# Patient Record
Sex: Male | Born: 1955 | Race: Black or African American | Hispanic: No | Marital: Single
Health system: Southern US, Community
[De-identification: ages and names within clinical notes are randomized; demographics above are authoritative.]

---

## 2020-09-22 ENCOUNTER — Emergency Department: Payer: No Typology Code available for payment source

## 2020-09-22 ENCOUNTER — Other Ambulatory Visit: Payer: Self-pay

## 2020-09-22 ENCOUNTER — Emergency Department
Admission: EM | Admit: 2020-09-22 | Discharge: 2020-09-22 | Disposition: A | Discharge status: Home / Self Care | Payer: No Typology Code available for payment source | Attending: Emergency Medicine | Admitting: Emergency Medicine

## 2020-09-22 DIAGNOSIS — W19XXXA Unspecified fall, initial encounter: Secondary | ICD-10-CM

## 2020-09-22 DIAGNOSIS — D72829 Elevated white blood cell count, unspecified: Secondary | ICD-10-CM | POA: Insufficient documentation

## 2020-09-22 DIAGNOSIS — S3991XA Unspecified injury of abdomen, initial encounter: Secondary | ICD-10-CM | POA: Insufficient documentation

## 2020-09-22 DIAGNOSIS — W132XXA Fall from, out of or through roof, initial encounter: Secondary | ICD-10-CM | POA: Diagnosis not present

## 2020-09-22 DIAGNOSIS — Y9289 Other specified places as the place of occurrence of the external cause: Secondary | ICD-10-CM | POA: Diagnosis not present

## 2020-09-22 DIAGNOSIS — R319 Hematuria, unspecified: Secondary | ICD-10-CM | POA: Diagnosis not present

## 2020-09-22 DIAGNOSIS — M545 Low back pain, unspecified: Secondary | ICD-10-CM | POA: Diagnosis not present

## 2020-09-22 LAB — URINALYSIS, COMPLETE (UACMP) WITH MICROSCOPIC
Bilirubin Urine: NEGATIVE
Glucose, UA: NEGATIVE mg/dL
Ketones, ur: NEGATIVE mg/dL
Nitrite: NEGATIVE
Protein, ur: NEGATIVE mg/dL
Specific Gravity, Urine: 1.026 (ref 1.005–1.030)
Squamous Epithelial / HPF: NONE SEEN (ref 0–5)
pH: 9 — ABNORMAL HIGH (ref 5.0–8.0)

## 2020-09-22 LAB — CBC WITH DIFFERENTIAL/PLATELET
Abs Immature Granulocytes: 0.04 10*3/uL (ref 0.00–0.07)
Basophils Absolute: 0 10*3/uL (ref 0.0–0.1)
Basophils Relative: 0 %
Eosinophils Absolute: 0.1 10*3/uL (ref 0.0–0.5)
Eosinophils Relative: 1 %
HCT: 37.8 % — ABNORMAL LOW (ref 39.0–52.0)
Hemoglobin: 13.1 g/dL (ref 13.0–17.0)
Immature Granulocytes: 0 %
Lymphocytes Relative: 15 %
Lymphs Abs: 1.8 10*3/uL (ref 0.7–4.0)
MCH: 32 pg (ref 26.0–34.0)
MCHC: 34.7 g/dL (ref 30.0–36.0)
MCV: 92.4 fL (ref 80.0–100.0)
Monocytes Absolute: 0.8 10*3/uL (ref 0.1–1.0)
Monocytes Relative: 7 %
Neutro Abs: 9.4 10*3/uL — ABNORMAL HIGH (ref 1.7–7.7)
Neutrophils Relative %: 77 %
Platelets: 256 10*3/uL (ref 150–400)
RBC: 4.09 MIL/uL — ABNORMAL LOW (ref 4.22–5.81)
RDW: 13.6 % (ref 11.5–15.5)
WBC: 12.1 10*3/uL — ABNORMAL HIGH (ref 4.0–10.5)
nRBC: 0 % (ref 0.0–0.2)

## 2020-09-22 LAB — COMPREHENSIVE METABOLIC PANEL
ALT: 15 U/L (ref 0–44)
AST: 24 U/L (ref 15–41)
Albumin: 4.1 g/dL (ref 3.5–5.0)
Alkaline Phosphatase: 56 U/L (ref 38–126)
Anion gap: 6 (ref 5–15)
BUN: 12 mg/dL (ref 8–23)
CO2: 27 mmol/L (ref 22–32)
Calcium: 8.9 mg/dL (ref 8.9–10.3)
Chloride: 102 mmol/L (ref 98–111)
Creatinine, Ser: 0.86 mg/dL (ref 0.61–1.24)
GFR, Estimated: >60 mL/min (ref >60)
Glucose, Bld: 113 mg/dL — ABNORMAL HIGH (ref 70–99)
Potassium: 3.6 mmol/L (ref 3.5–5.1)
Sodium: 135 mmol/L (ref 135–145)
Total Bilirubin: 1.2 mg/dL (ref 0.3–1.2)
Total Protein: 8.1 g/dL (ref 6.5–8.1)

## 2020-09-22 LAB — CK: Total CK: 170 U/L (ref 49–397)

## 2020-09-22 MED ORDER — IOHEXOL 350 MG/ML SOLN
100.0000 mL | Freq: Once | INTRAVENOUS | Status: AC | PRN
  Administered 2020-09-22: 100 mL via INTRAVENOUS

## 2020-09-22 MED ORDER — MORPHINE SULFATE (PF) 4 MG/ML IV SOLN
4.0000 mg | Freq: Once | INTRAVENOUS | Status: AC
  Administered 2020-09-22: 4 mg via INTRAVENOUS
  Filled 2020-09-22: qty 1

## 2020-09-22 NOTE — ED Triage Notes (Signed)
Pt states that he fell through a ceiling at work about 4 hours ago. States he fell about 6 feet and landed on a joist on his bottom. States lower abd pain, back pain, and blood in urine since then. Ambulatory at this time.

## 2020-09-22 NOTE — ED Provider Notes (Signed)
-----------------------------------------   3:38 PM on 09/22/2020 ----------------------------------------- I personally seen and evaluated the patient in conjunction with physician assistant Heron Sabins.  Patient appears very well.  Denies any pain at all currently.  Denies any syncope denies any incontinence.  Did states shortly after the incident he urinated what appeared to be dark, but is since cleared.  We will send a urinalysis.  Patient has a benign abdominal exam currently.  No back tenderness no step-offs or deformities.  No CVA tenderness.  No signs of weakness.  Patient's work-up is thus far reassuring CT scan did note hyperattenuation around the rectum/anus however rectal examination shows no gross blood and is nontender.  As the patient appears well with reassuring physical exam I do believe he would be safe for discharge home pending a urinalysis to ensure no hematuria.   Minna Antis, MD 09/22/20 2204

## 2020-09-22 NOTE — ED Provider Notes (Signed)
[facility] Emergency Department Provider Note  ____________________________________________   Event Date/Time   First MD Initiated Contact with Patient 09/22/20 1306     (approximate)  I have reviewed the triage vital signs and the nursing notes.   HISTORY  Chief Complaint Fall and Hematuria  HPI Austin Morse is a 65 y.o. male with no significant past medical history who reports to the emergency department after a significant fall.  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  He denies urinary incontinence.  He is ambulatory, denies saddle anesthesia, however is reporting significant abdominal pain and low back pain associated with gross hematuria that has now seemingly resolved.  He denies any head trauma during the fall, denies neck pain.         History reviewed. No pertinent past medical history.  There are no problems to display for this patient.    Prior to Admission medications   Not on File    Allergies Patient has no allergy information on record.  History reviewed. No pertinent family history.  Social History    Review of Systems Constitutional: + Trauma, no fever/chills Eyes: No visual changes. ENT: No sore throat. Cardiovascular: Denies chest pain. Respiratory: Denies shortness of breath. Gastrointestinal: + abdominal pain.  No nausea, no vomiting.  No diarrhea.  No constipation. Genitourinary: + Hematuria negative for dysuria. Musculoskeletal: + back pain. Skin: Negative for rash. Neurological: Negative for headaches, focal weakness or numbness.  ____________________________________________   PHYSICAL EXAM:  VITAL SIGNS: ED Triage Vitals  Enc Vitals Group     BP 09/22/20 1258 (!) 144/95     Pulse Rate 09/22/20 1258 87     Resp 09/22/20 1258 18     Temp 09/22/20 1258 98.6 F (37 C)     Temp src --       SpO2 09/22/20 1258 95 %     Weight 09/22/20 1303 165 lb (74.8 kg)     Height 09/22/20 1303 6' (1.829 m)     Head Circumference --      Peak Flow --      Pain Score 09/22/20 1303 8     Pain Loc --      Pain Edu? --      Excl. in GC? --    Constitutional: Alert and oriented. Well appearing and in no acute distress. Eyes: Conjunctivae are normal. PERRL. EOMI. Head: Atraumatic. Nose: No congestion/rhinnorhea. Mouth/Throat: Mucous membranes are moist.   Neck: No stridor.  No tenderness to palpation of the midline or paraspinals of the cervical spine.  Full range of motion. Cardiovascular: Normal rate, regular rhythm. Grossly normal heart sounds.  Good peripheral circulation. Respiratory: Normal respiratory effort.  No retractions. Lungs CTAB. Gastrointestinal: Soft, with possible mild distention.  Tender to palpation without rebound or peritoneal signs of the lower abdomen and suprapubic region. No abdominal bruits. No CVA tenderness.  Rectal exam following CT findings is normal with no gross bleeding externally or on digital rectal exam. Musculoskeletal: No tenderness to palpation of the midline of the thoracic or lumbar spine, no paraspinal tenderness.  No step-off deformities or crepitus.  No lower extremity tenderness nor edema.  No joint effusions. Neurologic:  Normal speech and language.  Cranial nerves II through XII grossly intact.  No gross focal neurologic deficits are appreciated. No gait instability. Skin:  Skin is warm, dry and intact. No  rash noted. Psychiatric: Mood and affect are normal. Speech and behavior are normal.  ____________________________________________   LABS (all labs ordered are listed, but only abnormal results are displayed)  Labs Reviewed  COMPREHENSIVE METABOLIC PANEL - Abnormal; Notable for the following components:      Result Value   Glucose, Bld 113 (*)    All other components within normal limits  CBC WITH DIFFERENTIAL/PLATELET - Abnormal; Notable  for the following components:   WBC 12.1 (*)    RBC 4.09 (*)    HCT 37.8 (*)    Neutro Abs 9.4 (*)    All other components within normal limits  URINALYSIS, COMPLETE (UACMP) WITH MICROSCOPIC - Abnormal; Notable for the following components:   Color, Urine YELLOW (*)    APPearance CLEAR (*)    pH 9.0 (*)    Hgb urine dipstick MODERATE (*)    Leukocytes,Ua MODERATE (*)    Bacteria, UA RARE (*)    All other components within normal limits  CK  TYPE AND SCREEN   ____________________________________________  RADIOLOGY  Official radiology report(s): CT CHEST ABDOMEN PELVIS W CONTRAST  Result Date: 09/22/2020 CLINICAL DATA:  Fall 6 ft.  Abdominal trauma. EXAM: CT CHEST, ABDOMEN, AND PELVIS WITH CONTRAST TECHNIQUE: Multidetector CT imaging of the chest, abdomen and pelvis was performed following the standard protocol during bolus administration of intravenous contrast. CONTRAST:  OMNIPAQUE IOHEXOL 350 MG/ML SOLN COMPARISON:  None. FINDINGS: CT CHEST FINDINGS Cardiovascular: Heart is normal size. Aorta is normal caliber. Scattered coronary artery and aortic calcifications. Mediastinum/Nodes: No mediastinal, hilar, or axillary adenopathy. Trachea and esophagus are unremarkable. Lungs/Pleura: Dependent atelectasis in the lower lobes. No confluent opacities or effusions. Musculoskeletal: No acute bony abnormality. CT ABDOMEN PELVIS FINDINGS Hepatobiliary: No hepatic injury or perihepatic hematoma. Gallbladder is unremarkable. Pancreas: No focal abnormality or ductal dilatation. Spleen: No splenic injury or perisplenic hematoma. Adrenals/Urinary Tract: No adrenal hemorrhage or renal injury identified. Bladder is unremarkable. Stomach/Bowel: High-density material is noted in the region of the anus/rectum seen on image 121 of series 2 of unknown etiology. Is the patient having GI bleed issues? This could be extravasation of contrast. Remainder the bowel is unremarkable. No bowel obstruction.  Vascular/Lymphatic: No evidence of aneurysm or adenopathy. Scattered aortic calcifications. Reproductive: No visible focal abnormality. Other: No free fluid or free air. Musculoskeletal: No acute bony abnormality IMPRESSION: Area of high density noted in the anus/rectum. Given the lack of oral contrast, I cannot exclude vascular contrast extravasation. Recommend clinical correlation for possible anal injury with bleeding. This would likely be visible on visual or digital rectal exam given the low location. No evidence of solid organ injury or free air. Aortic atherosclerosis. Electronically Signed   By: Charlett Nose M.D.   On: 09/22/2020 15:21   CT T-SPINE NO CHARGE  Result Date: 09/22/2020 CLINICAL DATA:  Abdominal trauma, hematuria; fall through ceiling with back pain EXAM: CT Thoracic and Lumbar spine with contrast TECHNIQUE: Multiplanar CT images of the thoracic and lumbar spine were reconstructed from contemporary CT of the Chest, Abdomen, and Pelvis CONTRAST:  No additional COMPARISON:  None. FINDINGS: CT THORACIC SPINE Alignment: Preserved. Vertebrae: No acute fracture. Vertebral body heights are maintained. Paraspinal and other soft tissues: Extra-spinal findings are better evaluated on concurrent dedicated imaging. Disc levels: Intervertebral disc heights are maintained. There is no significant osseous encroachment on the spinal canal or neural foramina. CT LUMBAR SPINE Segmentation: 5 lumbar type vertebrae. Alignment: Preserved. Vertebrae: No acute fracture. Vertebral body heights are maintained.  Paraspinal and other soft tissues: Extra-spinal findings are better evaluated on concurrent dedicated imaging. Disc levels: Disc bulges at L4-L5 and L5-S1. Endplate osteophytic ridging at L5-S1. Mild lower lumbar facet hypertrophy. No high-grade canal stenosis. Mild foraminal narrowing. IMPRESSION: No acute fracture of the thoracolumbar spine. Electronically Signed   By: Guadlupe Spanish M.D.   On: 09/22/2020  15:02   CT L-SPINE NO CHARGE  Result Date: 09/22/2020 CLINICAL DATA:  Abdominal trauma, hematuria; fall through ceiling with back pain EXAM: CT Thoracic and Lumbar spine with contrast TECHNIQUE: Multiplanar CT images of the thoracic and lumbar spine were reconstructed from contemporary CT of the Chest, Abdomen, and Pelvis CONTRAST:  No additional COMPARISON:  None. FINDINGS: CT THORACIC SPINE Alignment: Preserved. Vertebrae: No acute fracture. Vertebral body heights are maintained. Paraspinal and other soft tissues: Extra-spinal findings are better evaluated on concurrent dedicated imaging. Disc levels: Intervertebral disc heights are maintained. There is no significant osseous encroachment on the spinal canal or neural foramina. CT LUMBAR SPINE Segmentation: 5 lumbar type vertebrae. Alignment: Preserved. Vertebrae: No acute fracture. Vertebral body heights are maintained. Paraspinal and other soft tissues: Extra-spinal findings are better evaluated on concurrent dedicated imaging. Disc levels: Disc bulges at L4-L5 and L5-S1. Endplate osteophytic ridging at L5-S1. Mild lower lumbar facet hypertrophy. No high-grade canal stenosis. Mild foraminal narrowing. IMPRESSION: No acute fracture of the thoracolumbar spine. Electronically Signed   By: Guadlupe Spanish M.D.   On: 09/22/2020 15:02    ____________________________________________   INITIAL IMPRESSION / ASSESSMENT AND PLAN / ED COURSE  As part of my medical decision making, I reviewed the following data within the electronic MEDICAL RECORD NUMBER Nursing notes reviewed and incorporated, Labs reviewed, Evaluated by EM attending Dr. Marisa Severin, and Notes from prior ED visits        Patient is a 65 year old male who presents to the emergency department after a trauma falling approximately 6 feet onto a floor joist onto his bottom.  He then had significant low back pain that was not reproducible to palpation, but with associated hematuria initially that has now  resolved.  See HPI for further details.  Initial evaluation was obtained with CBC, CMP, urinalysis.  CBC with a mild leukocytosis of 12.1 with left shift.  CMP with mild elevation in glucose, otherwise within normal limits.  CT of the chest abdomen pelvis with contrast was obtained given mechanism and concern for intra-abdominal pathology.  CT of the lumbar and thoracic spine was also obtained given mechanism with associated back pain.  CT of the thoracic and lumbar spine was within normal limits.  CT of the chest abdomen pelvis with contrast was concerning for possible contrast-enhancement at the rectum and could not rule out extravasation.  At that time, repeat exam was had with attending Dr. Lenard Lance present for chaperoning, and there was no gross blood noted on external or digital rectal exam.  Urinalysis then returned with rare bacteria, moderate leukocytes and moderate hemoglobin with few red blood cells.  Patient does not have any dysuria or preceding symptoms concerning for UTI, will send this urine for culture.  Given the moderate hemoglobin with trauma, CK was ordered by Dr. Lenard Lance to rule out traumatic rhabdomyolysis, and this was within normal limits at 170.   At this time, the patient's evaluation and labs and imaging are reassuring.  He is not reporting any significant pain at this time and reports now resolution in his gross hematuria.  At this time, feel the patient is stable for discharge.  Return precautions were discussed at length and patient is amenable with returning if he has any worsening symptoms.  Patient also seen and examined by attending Dr. Marisa Severin, and later by Dr. Lenard Lance at change of shift who both agree with the above work-up and plan of care.      ____________________________________________   FINAL CLINICAL IMPRESSION(S) / ED DIAGNOSES  Final diagnoses:  Abdominal trauma  Hematuria  Fall, initial encounter  Acute low back pain, unspecified back  pain laterality, unspecified whether sciatica present     ED Discharge Orders     None        Note:  This document was prepared using Dragon voice recognition software and may include unintentional dictation errors.    Lucy Chris, PA 09/22/20 1945    Dionne Bucy, MD 09/23/20 [id number]

## 2020-09-22 NOTE — ED Notes (Signed)
Pt alert and oriented x 4 verbalized discharge instructions

## 2020-09-22 NOTE — Discharge Instructions (Addendum)
Please follow up with your primary care. Return to the Er if you develop any worsening pain or changes in urination or other significant changes. Take Tylenol, up to 1000mg  4x daily as needed for pain.

## 2020-09-22 NOTE — ED Notes (Signed)
Per manager Tim, patient needs a UDS prior to leaving hospital. Notified appropriate trained personal to obtain specimen.

## 2020-09-25 LAB — URINE CULTURE: Culture: >=100000 — AB

## 2020-09-26 NOTE — Progress Notes (Signed)
ED culture results-  65YOM presented to the ED with back pain and blood in the urine after a fall. Hematuria had since cleared once in the ED per patient. No reports of dysuria. UA was clear with negative nitrites, moderate leukocytes, and rare bacteria. No antibiotics prescribed at discharge. Urine culture resulted E. Coli. Do not recommend initiating antibiotics for this asymptomatic bacteruria. Discussed case with Dr. Katrinka Blazing who agreed, no antibiotics at this time.   Jaynie Bream, PharmD Pharmacy Resident  09/26/2020 5:36 PM

## 2022-12-25 IMAGING — CT CT CHEST-ABD-PELV W/ CM
2 of 6 series · 11 of 36 positions shown, 16 images · IV contrast (omnipaque)
Comparison: None.

CLINICAL DATA: Fall 6 ft.  Abdominal trauma.

EXAM:
CT CHEST, ABDOMEN, AND PELVIS WITH CONTRAST
TECHNIQUE: Multidetector CT imaging of the chest, abdomen and pelvis was
performed following the standard protocol during bolus
administration of intravenous contrast.
CONTRAST:  100mL OMNIPAQUE IOHEXOL 350 MG/ML SOLN

[Series 2: cap with · axial · 0.73mm/px · z∈[-843,-328]mm · 8 of 133 slices shown, 13 images]
[im 15/133  mediastinal]
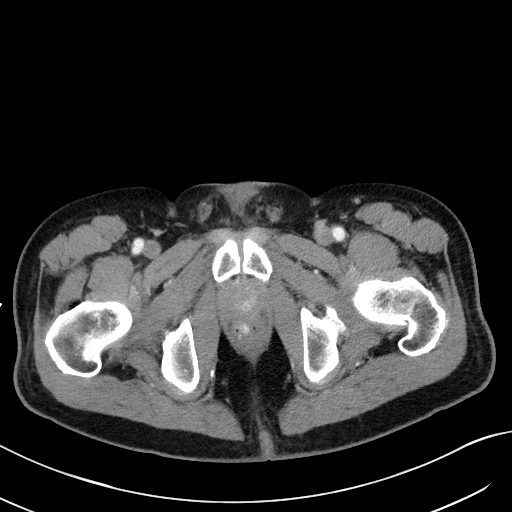
[im 15/133  bone]
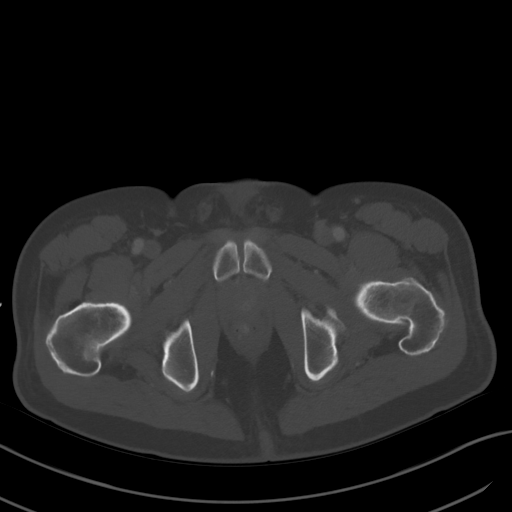
[im 30/133  mediastinal]
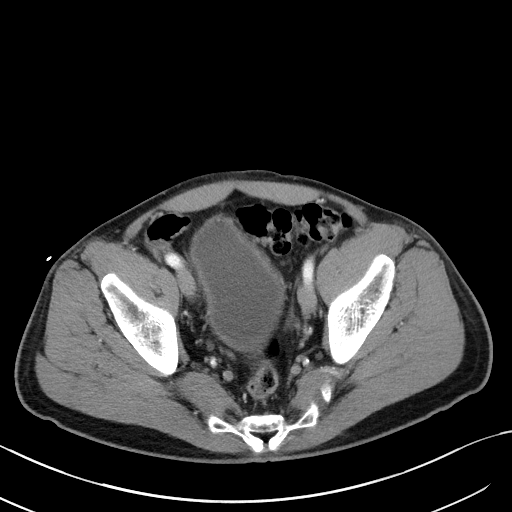
[im 45/133  mediastinal]
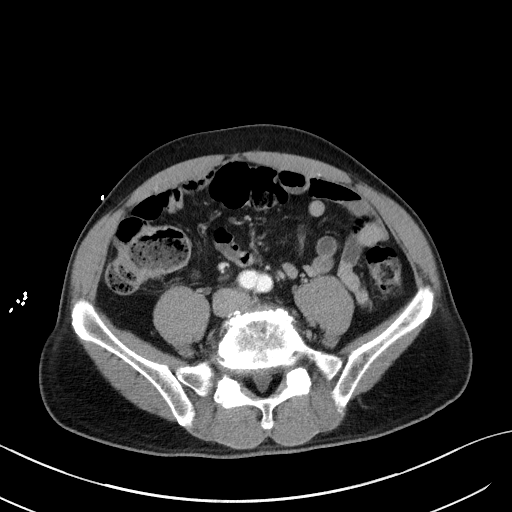
[im 59/133  mediastinal]
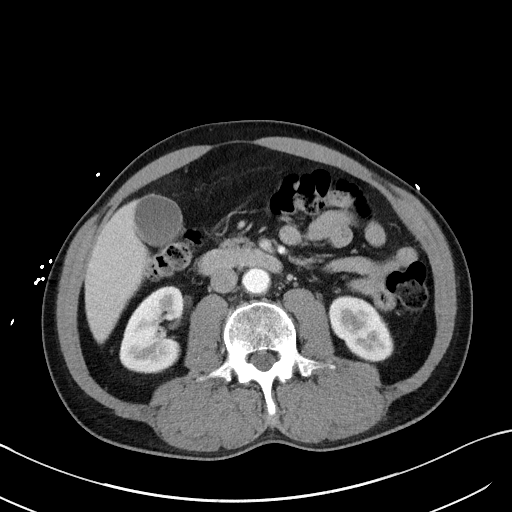
[im 74/133  mediastinal]
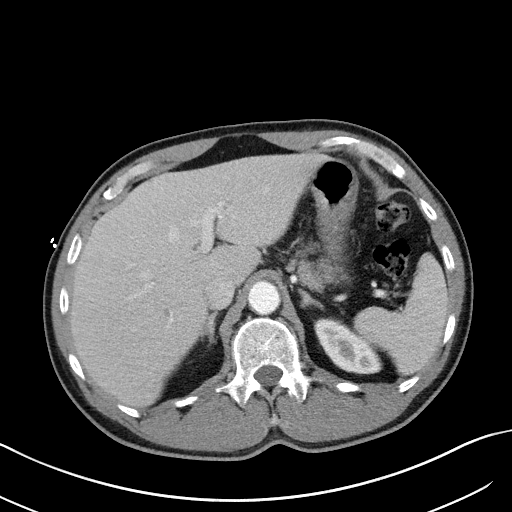
[im 74/133  lung]
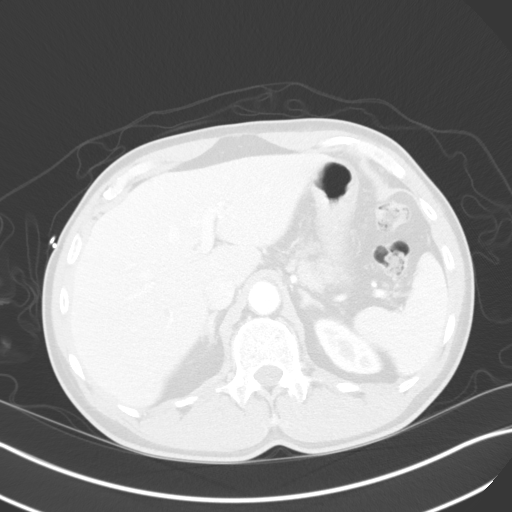
[im 89/133  mediastinal]
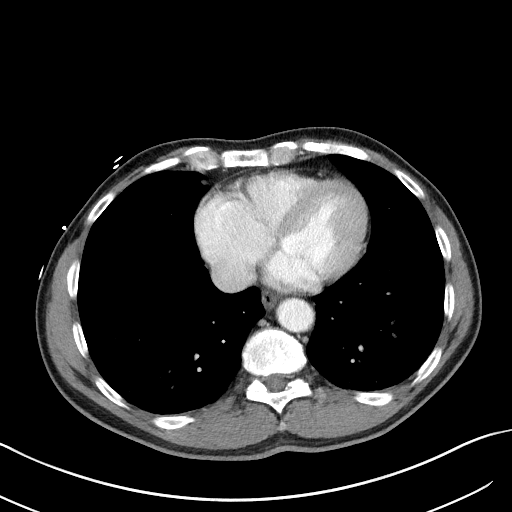
[im 89/133  lung]
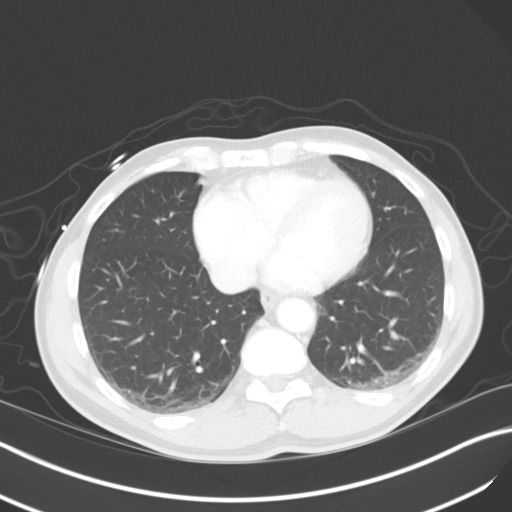
[im 103/133  mediastinal]
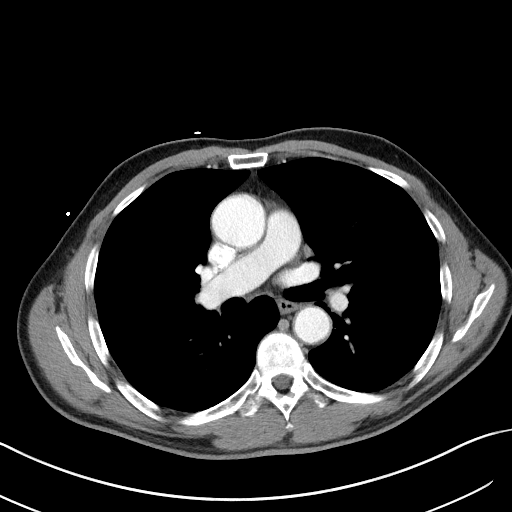
[im 103/133  lung]
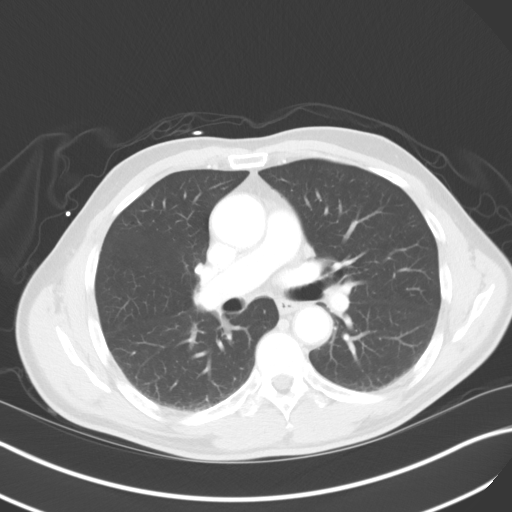
[im 118/133  mediastinal]
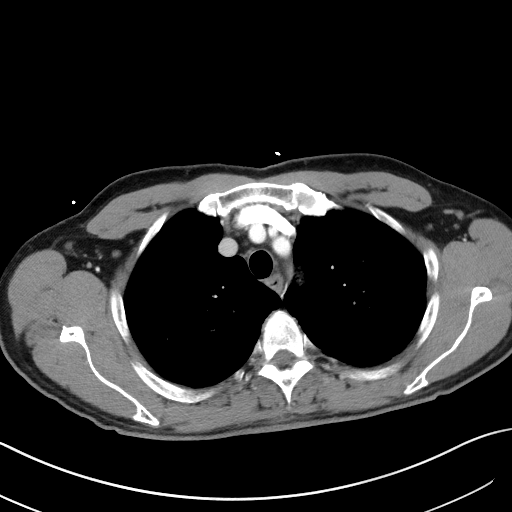
[im 118/133  lung]
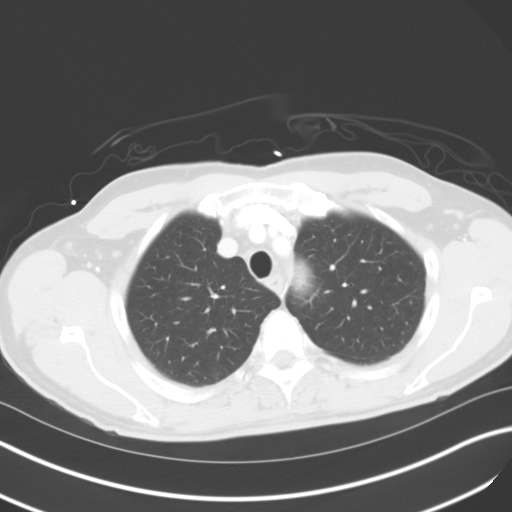

[Series 5: coronals · coronal · 0.82mm/px · 3 of 127 slices shown]
[im 26/127  mediastinal]
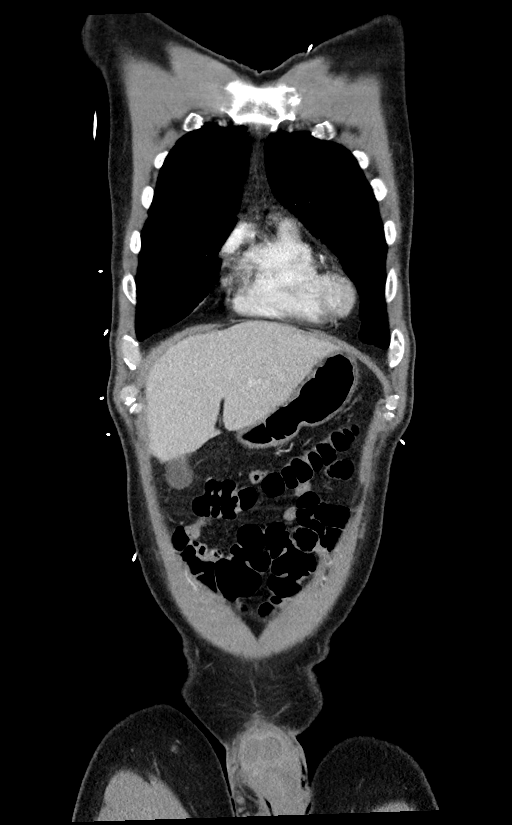
[im 51/127  mediastinal]
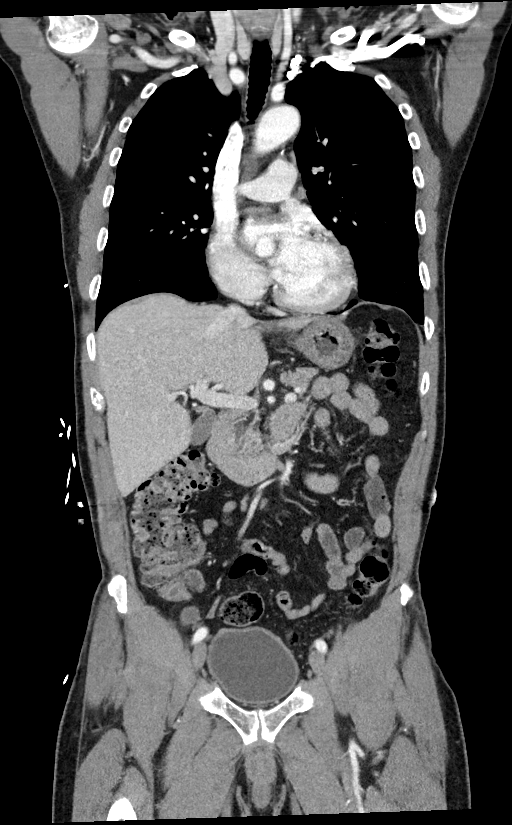
[im 76/127  mediastinal]
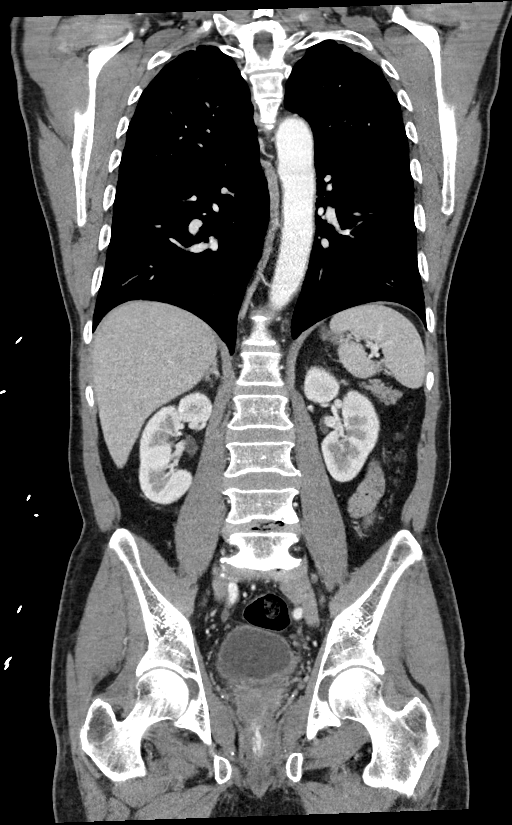

[11 of 36 positions shown; findings below may reference images not displayed]

FINDINGS: CT CHEST FINDINGS

Cardiovascular: Heart is normal size. Aorta is normal caliber.
Scattered coronary artery and aortic calcifications.

Mediastinum/Nodes: No mediastinal, hilar, or axillary adenopathy.
Trachea and esophagus are unremarkable.

Lungs/Pleura: Dependent atelectasis in the lower lobes. No confluent
opacities or effusions.

Musculoskeletal: No acute bony abnormality.

CT ABDOMEN PELVIS FINDINGS

Hepatobiliary: No hepatic injury or perihepatic hematoma.
Gallbladder is unremarkable.

Pancreas: No focal abnormality or ductal dilatation.

Spleen: No splenic injury or perisplenic hematoma.

Adrenals/Urinary Tract: No adrenal hemorrhage or renal injury
identified. Bladder is unremarkable.

Stomach/Bowel: High-density material is noted in the region of the
anus/rectum seen on image 121 of series 2 of unknown etiology. Is
the patient having GI bleed issues? This could be extravasation of
contrast. Remainder the bowel is unremarkable. No bowel obstruction.

Vascular/Lymphatic: No evidence of aneurysm or adenopathy. Scattered
aortic calcifications.

Reproductive: No visible focal abnormality.

Other: No free fluid or free air.

Musculoskeletal: No acute bony abnormality
IMPRESSION: Area of high density noted in the anus/rectum. Given the lack of
oral contrast, I cannot exclude vascular contrast extravasation.
Recommend clinical correlation for possible anal injury with
bleeding. This would likely be visible on visual or digital rectal
exam given the low location.

No evidence of solid organ injury or free air.

Aortic atherosclerosis.

## 2022-12-25 IMAGING — CT CT T SPINE W/O CM
3 series · 9 of 33 positions shown, 10 images · IV contrast (agent unspecified)
Comparison: None.

CLINICAL DATA: Abdominal trauma, hematuria; fall through ceiling
with back pain

EXAM:
CT Thoracic and Lumbar spine with contrast
TECHNIQUE: Multiplanar CT images of the thoracic and lumbar spine were
reconstructed from contemporary CT of the Chest, Abdomen, and Pelvis
CONTRAST:  No additional

[Series 3: spine axial st · axial · 0.32mm/px · z∈[-493,-493]mm · 1 of 261 slices shown, 2 images (1 of 2)]
[im 141/261  soft-tissue]
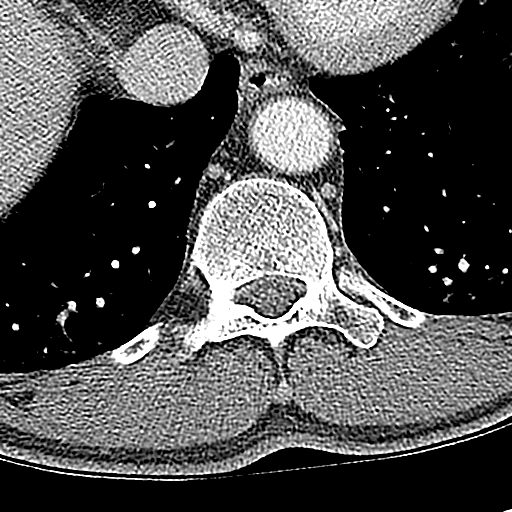
[im 141/261  bone]
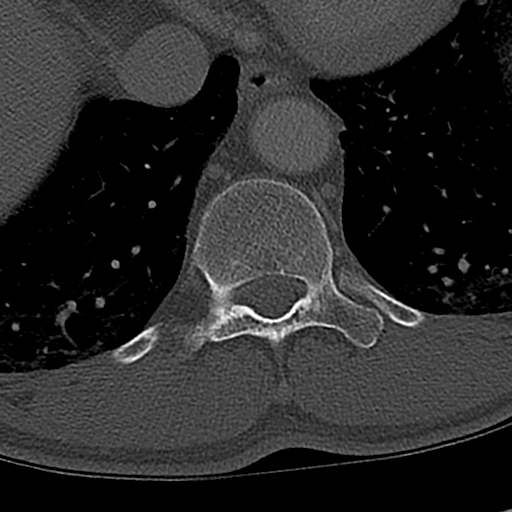

[Series 6: spine axial st · sagittal · 0.38mm/px · 5 of 61 slices shown (2 of 2)]
[im 21/61  bone]
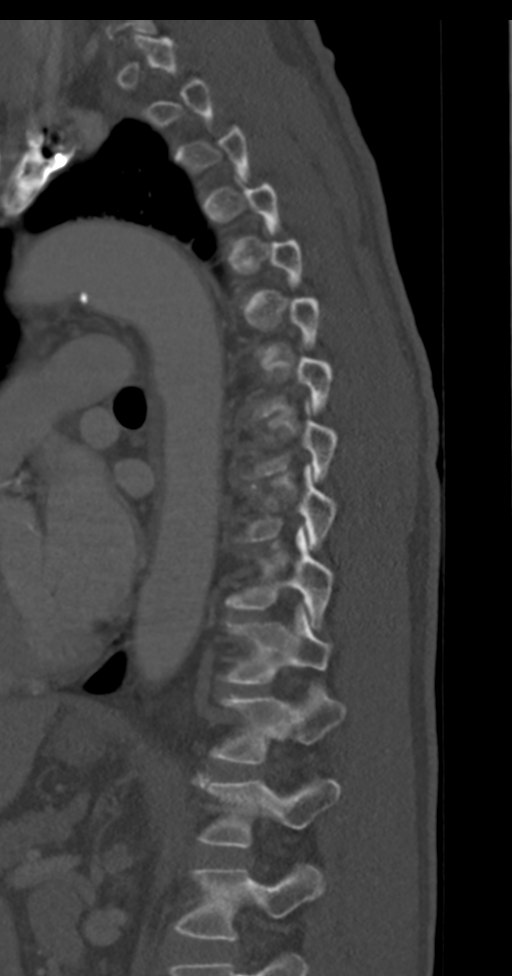
[im 26/61  bone]
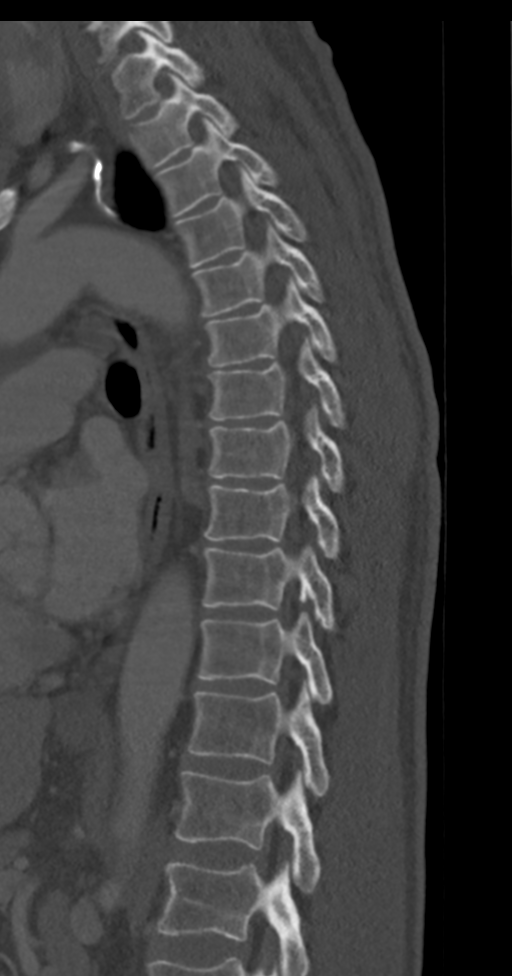
[im 31/61  bone]
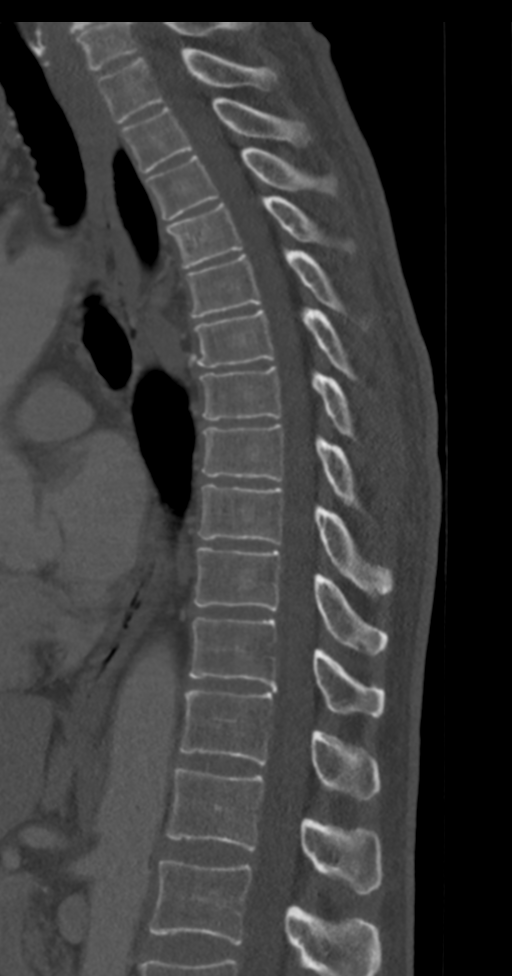
[im 36/61  bone]
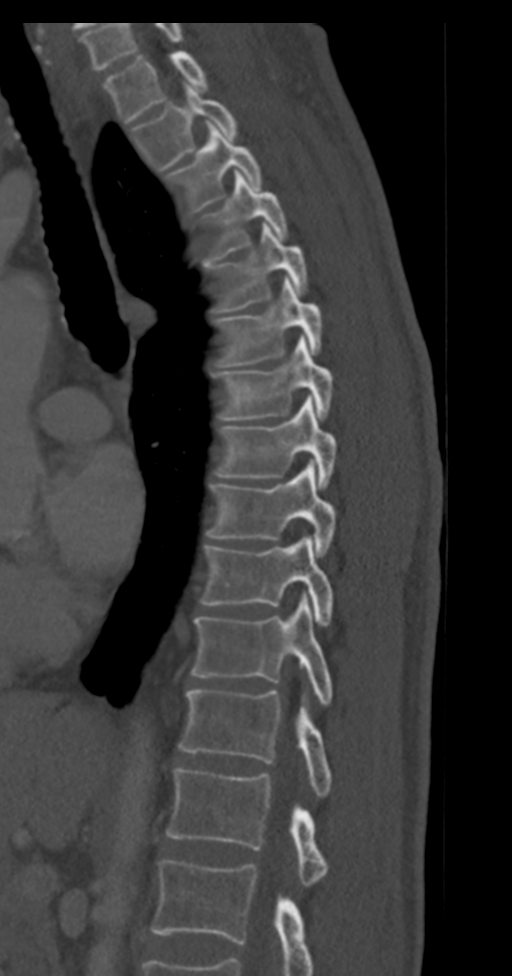
[im 41/61  bone]
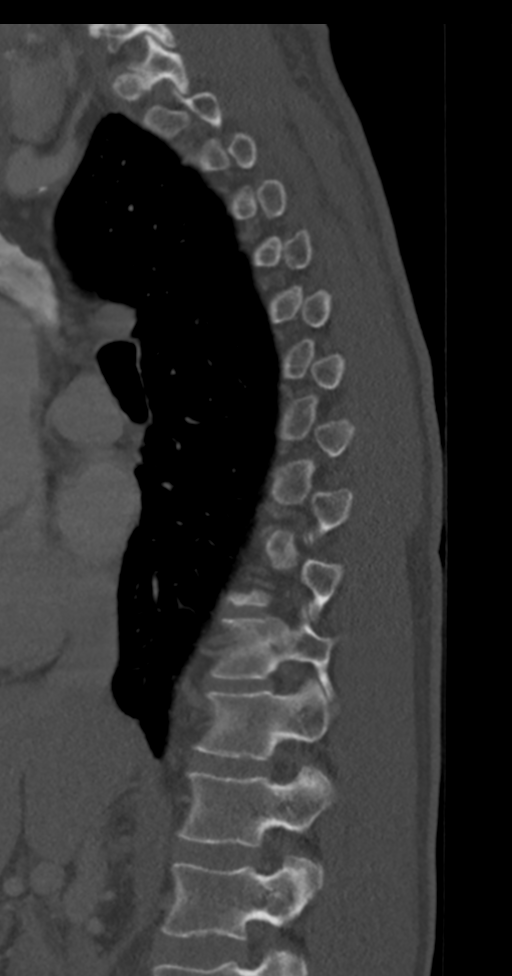

[Series 7: spine cor st · coronal · 0.24mm/px · 3 of 78 slices shown]
[im 16/78  bone]
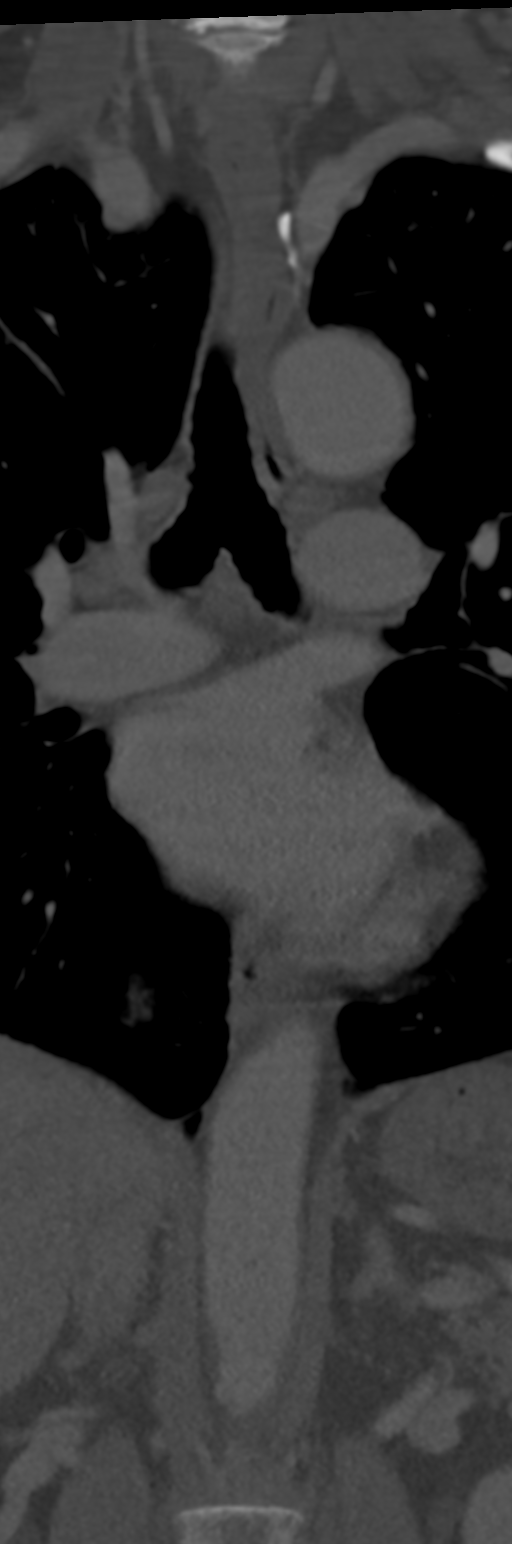
[im 31/78  bone]
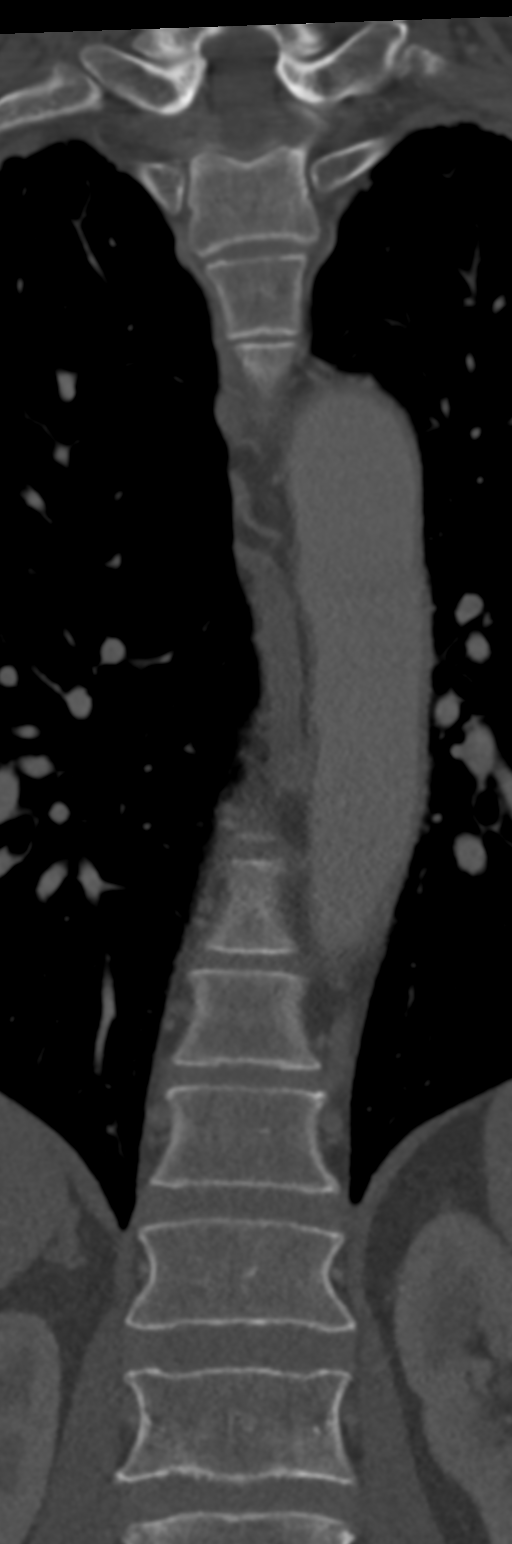
[im 47/78  bone]
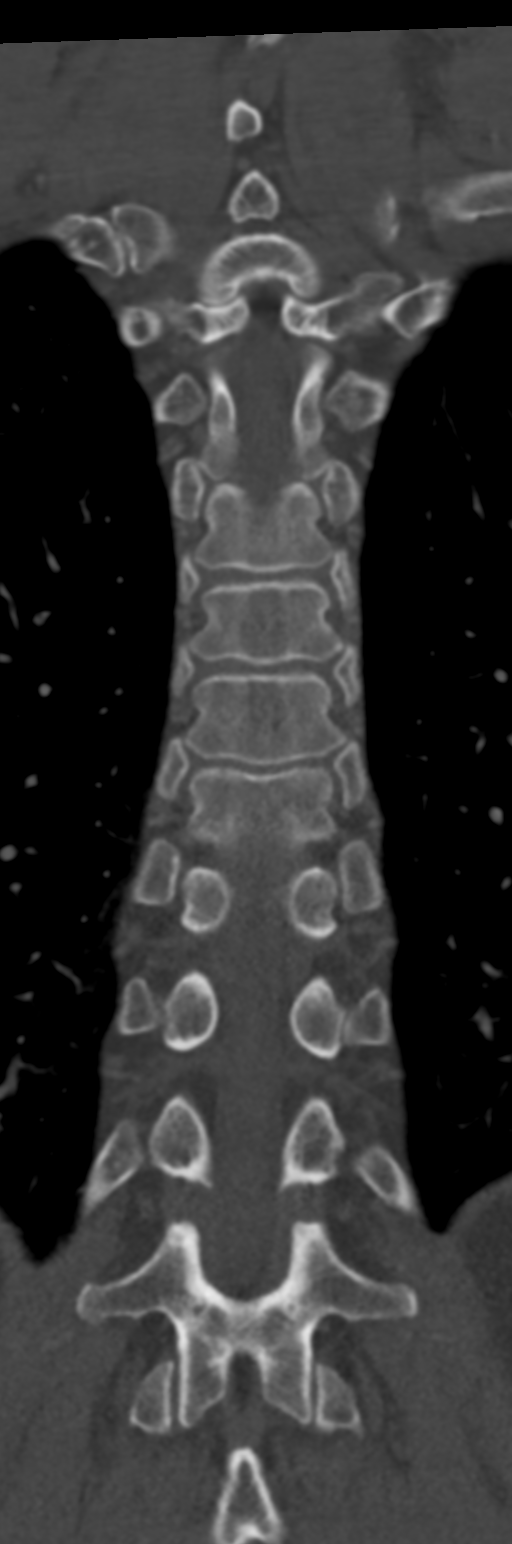

[9 of 33 positions shown; findings below may reference images not displayed]

FINDINGS: CT THORACIC SPINE

Alignment: Preserved.

Vertebrae: No acute fracture. Vertebral body heights are maintained.

Paraspinal and other soft tissues: Extra-spinal findings are better
evaluated on concurrent dedicated imaging.

Disc levels: Intervertebral disc heights are maintained. There is no
significant osseous encroachment on the spinal canal or neural
foramina.

CT LUMBAR SPINE

Segmentation: 5 lumbar type vertebrae.

Alignment: Preserved.

Vertebrae: No acute fracture. Vertebral body heights are maintained.

Paraspinal and other soft tissues: Extra-spinal findings are better
evaluated on concurrent dedicated imaging.

Disc levels: Disc bulges at L4-L5 and L5-S1. Endplate osteophytic
ridging at L5-S1. Mild lower lumbar facet hypertrophy. No high-grade
canal stenosis. Mild foraminal narrowing.
IMPRESSION: No acute fracture of the thoracolumbar spine.
